# Patient Record
Sex: Male | Born: 1985
Health system: Southern US, Community
[De-identification: ages and names within clinical notes are randomized; demographics above are authoritative.]

---

## 2016-07-03 DIAGNOSIS — F411 Generalized anxiety disorder: Secondary | ICD-10-CM | POA: Diagnosis not present

## 2016-07-03 DIAGNOSIS — F909 Attention-deficit hyperactivity disorder, unspecified type: Secondary | ICD-10-CM | POA: Diagnosis not present

## 2016-07-03 DIAGNOSIS — F419 Anxiety disorder, unspecified: Secondary | ICD-10-CM | POA: Diagnosis not present

## 2017-06-17 DIAGNOSIS — R41840 Attention and concentration deficit: Secondary | ICD-10-CM | POA: Diagnosis not present

## 2017-07-13 DIAGNOSIS — F411 Generalized anxiety disorder: Secondary | ICD-10-CM | POA: Diagnosis not present

## 2017-07-13 DIAGNOSIS — F419 Anxiety disorder, unspecified: Secondary | ICD-10-CM | POA: Diagnosis not present

## 2017-12-16 DIAGNOSIS — F419 Anxiety disorder, unspecified: Secondary | ICD-10-CM | POA: Diagnosis not present

## 2017-12-16 DIAGNOSIS — F909 Attention-deficit hyperactivity disorder, unspecified type: Secondary | ICD-10-CM | POA: Diagnosis not present

## 2018-02-01 DIAGNOSIS — J45909 Unspecified asthma, uncomplicated: Secondary | ICD-10-CM | POA: Diagnosis not present

## 2018-02-01 DIAGNOSIS — J309 Allergic rhinitis, unspecified: Secondary | ICD-10-CM | POA: Diagnosis not present

## 2018-05-23 DIAGNOSIS — F909 Attention-deficit hyperactivity disorder, unspecified type: Secondary | ICD-10-CM | POA: Diagnosis not present

## 2018-05-23 DIAGNOSIS — F419 Anxiety disorder, unspecified: Secondary | ICD-10-CM | POA: Diagnosis not present

## 2018-09-29 ENCOUNTER — Encounter: Payer: Self-pay | Admitting: Podiatry

## 2018-09-29 ENCOUNTER — Other Ambulatory Visit: Payer: Self-pay

## 2018-09-29 ENCOUNTER — Ambulatory Visit: Payer: Managed Care, Other (non HMO) | Admitting: Podiatry

## 2018-09-29 VITALS — BP 120/88 | HR 74 | Temp 97.3°F | Resp 16

## 2018-09-29 DIAGNOSIS — B070 Plantar wart: Secondary | ICD-10-CM

## 2018-09-29 MED ORDER — FLUOROURACIL 5 % EX CREA: TOPICAL_CREAM | Freq: Two times a day (BID) | CUTANEOUS | 2 refills | Status: AC

## 2018-09-29 NOTE — Progress Notes (Signed)
   Subjective:    Patient ID: Steven Ray, male    DOB: 05-Jan-1986, 33 y.o.   MRN: [id number]  HPI    Review of Systems  All other systems reviewed and are negative.      Objective:   Physical Exam        Assessment & Plan:

## 2018-09-29 NOTE — Patient Instructions (Signed)

## 2018-09-29 NOTE — Progress Notes (Signed)
Subjective:   Patient ID: Steven Ray, male   DOB: 33 y.o.   MRN: [id number]   HPI Patient presents stating he has been having a lot of pain in the bottom of his right heel and the left fourth digit and states that this is been going on now for about 6 months and he is tried over-the-counter medicines which have not been successful.  Patient is quite active and likes to run and does not smoke.  Patient states they are both quite tender and came at the same time   Review of Systems  All other systems reviewed and are negative.       Objective:  Physical Exam Vitals signs and nursing note reviewed.  Constitutional:      Appearance: He is well-developed.  Pulmonary:     Effort: Pulmonary effort is normal.  Musculoskeletal: Normal range of motion.  Skin:    General: Skin is warm.  Neurological:     Mental Status: He is alert.     Neurovascular status intact muscle strength is adequate range of motion within normal limits.  Patient is found to have large keratotic lesion plantar lateral aspect right heel with dark spots and on the fourth digit left plantar digit with spotting also noted.  Upon debridement pinpoint bleeding is noted and they are painful to lateral pressure the patient was found to have good digital perfusion well oriented x3     Assessment:  Chronic verruca plantaris bilateral     Plan:  H&P education rendered and today sharp debridement accomplished and then application of immune agent to each area along with utilization of sterile dressings.  I instructed what to do if any blistering were to occur and did write prescription for Efudex to use in the next few days and patient will be seen back to evaluate again in the next 4 weeks or earlier if needed

## 2018-10-27 ENCOUNTER — Ambulatory Visit: Payer: Managed Care, Other (non HMO) | Admitting: Podiatry

## 2019-11-17 DIAGNOSIS — F419 Anxiety disorder, unspecified: Secondary | ICD-10-CM | POA: Diagnosis not present

## 2019-11-17 DIAGNOSIS — M25531 Pain in right wrist: Secondary | ICD-10-CM | POA: Diagnosis not present

## 2019-11-17 DIAGNOSIS — F909 Attention-deficit hyperactivity disorder, unspecified type: Secondary | ICD-10-CM | POA: Diagnosis not present

## 2019-12-24 DIAGNOSIS — B349 Viral infection, unspecified: Secondary | ICD-10-CM | POA: Diagnosis not present

## 2019-12-24 DIAGNOSIS — Z03818 Encounter for observation for suspected exposure to other biological agents ruled out: Secondary | ICD-10-CM | POA: Diagnosis not present

## 2019-12-24 DIAGNOSIS — R05 Cough: Secondary | ICD-10-CM | POA: Diagnosis not present

## 2020-01-02 DIAGNOSIS — Z113 Encounter for screening for infections with a predominantly sexual mode of transmission: Secondary | ICD-10-CM | POA: Diagnosis not present

## 2020-01-02 DIAGNOSIS — R05 Cough: Secondary | ICD-10-CM | POA: Diagnosis not present

## 2020-01-02 DIAGNOSIS — F411 Generalized anxiety disorder: Secondary | ICD-10-CM | POA: Diagnosis not present

## 2020-01-31 DIAGNOSIS — M25511 Pain in right shoulder: Secondary | ICD-10-CM | POA: Diagnosis not present

## 2020-02-14 DIAGNOSIS — M4722 Other spondylosis with radiculopathy, cervical region: Secondary | ICD-10-CM | POA: Diagnosis not present

## 2020-02-21 DIAGNOSIS — M542 Cervicalgia: Secondary | ICD-10-CM | POA: Diagnosis not present

## 2020-02-21 DIAGNOSIS — M5412 Radiculopathy, cervical region: Secondary | ICD-10-CM | POA: Diagnosis not present

## 2020-02-21 DIAGNOSIS — M25511 Pain in right shoulder: Secondary | ICD-10-CM | POA: Diagnosis not present

## 2020-02-27 DIAGNOSIS — M542 Cervicalgia: Secondary | ICD-10-CM | POA: Diagnosis not present

## 2020-02-27 DIAGNOSIS — M25511 Pain in right shoulder: Secondary | ICD-10-CM | POA: Diagnosis not present

## 2020-02-27 DIAGNOSIS — M5412 Radiculopathy, cervical region: Secondary | ICD-10-CM | POA: Diagnosis not present

## 2020-02-29 DIAGNOSIS — M25511 Pain in right shoulder: Secondary | ICD-10-CM | POA: Diagnosis not present

## 2020-02-29 DIAGNOSIS — M5412 Radiculopathy, cervical region: Secondary | ICD-10-CM | POA: Diagnosis not present

## 2020-02-29 DIAGNOSIS — M542 Cervicalgia: Secondary | ICD-10-CM | POA: Diagnosis not present

## 2020-03-05 DIAGNOSIS — M25511 Pain in right shoulder: Secondary | ICD-10-CM | POA: Diagnosis not present

## 2020-03-05 DIAGNOSIS — M5412 Radiculopathy, cervical region: Secondary | ICD-10-CM | POA: Diagnosis not present

## 2020-03-05 DIAGNOSIS — M542 Cervicalgia: Secondary | ICD-10-CM | POA: Diagnosis not present

## 2020-03-07 DIAGNOSIS — M25511 Pain in right shoulder: Secondary | ICD-10-CM | POA: Diagnosis not present

## 2020-03-07 DIAGNOSIS — M5412 Radiculopathy, cervical region: Secondary | ICD-10-CM | POA: Diagnosis not present

## 2020-03-07 DIAGNOSIS — M542 Cervicalgia: Secondary | ICD-10-CM | POA: Diagnosis not present

## 2020-03-11 DIAGNOSIS — M5412 Radiculopathy, cervical region: Secondary | ICD-10-CM | POA: Diagnosis not present

## 2020-03-11 DIAGNOSIS — M25511 Pain in right shoulder: Secondary | ICD-10-CM | POA: Diagnosis not present

## 2020-03-11 DIAGNOSIS — M542 Cervicalgia: Secondary | ICD-10-CM | POA: Diagnosis not present

## 2020-03-13 DIAGNOSIS — M25511 Pain in right shoulder: Secondary | ICD-10-CM | POA: Diagnosis not present

## 2020-03-13 DIAGNOSIS — M542 Cervicalgia: Secondary | ICD-10-CM | POA: Diagnosis not present

## 2020-03-13 DIAGNOSIS — M5412 Radiculopathy, cervical region: Secondary | ICD-10-CM | POA: Diagnosis not present

## 2020-03-14 ENCOUNTER — Emergency Department (HOSPITAL_COMMUNITY)
Admission: EM | Admit: 2020-03-14 | Discharge: 2020-03-14 | Disposition: A | Discharge status: Home / Self Care | Payer: BC Managed Care – PPO | Attending: Emergency Medicine | Admitting: Emergency Medicine

## 2020-03-14 ENCOUNTER — Encounter (HOSPITAL_COMMUNITY): Payer: Self-pay

## 2020-03-14 ENCOUNTER — Other Ambulatory Visit: Payer: Self-pay

## 2020-03-14 ENCOUNTER — Emergency Department (HOSPITAL_COMMUNITY): Payer: BC Managed Care – PPO

## 2020-03-14 DIAGNOSIS — S91311A Laceration without foreign body, right foot, initial encounter: Secondary | ICD-10-CM | POA: Insufficient documentation

## 2020-03-14 DIAGNOSIS — S61012A Laceration without foreign body of left thumb without damage to nail, initial encounter: Secondary | ICD-10-CM | POA: Diagnosis not present

## 2020-03-14 DIAGNOSIS — S01311A Laceration without foreign body of right ear, initial encounter: Secondary | ICD-10-CM | POA: Insufficient documentation

## 2020-03-14 DIAGNOSIS — W25XXXA Contact with sharp glass, initial encounter: Secondary | ICD-10-CM | POA: Insufficient documentation

## 2020-03-14 DIAGNOSIS — S99921A Unspecified injury of right foot, initial encounter: Secondary | ICD-10-CM | POA: Diagnosis not present

## 2020-03-14 DIAGNOSIS — R55 Syncope and collapse: Secondary | ICD-10-CM | POA: Insufficient documentation

## 2020-03-14 DIAGNOSIS — Z23 Encounter for immunization: Secondary | ICD-10-CM | POA: Diagnosis not present

## 2020-03-14 LAB — URINALYSIS, ROUTINE W REFLEX MICROSCOPIC
Bilirubin Urine: NEGATIVE
Glucose, UA: NEGATIVE mg/dL
Hgb urine dipstick: NEGATIVE
Ketones, ur: 5 mg/dL — AB
Leukocytes,Ua: NEGATIVE
Nitrite: NEGATIVE
Protein, ur: 30 mg/dL — AB
Specific Gravity, Urine: 1.021 (ref 1.005–1.030)
pH: 8 (ref 5.0–8.0)

## 2020-03-14 LAB — BASIC METABOLIC PANEL
Anion gap: 11 (ref 5–15)
BUN: 18 mg/dL (ref 6–20)
CO2: 22 mmol/L (ref 22–32)
Calcium: 9.5 mg/dL (ref 8.9–10.3)
Chloride: 104 mmol/L (ref 98–111)
Creatinine, Ser: 1.11 mg/dL (ref 0.61–1.24)
GFR, Estimated: >60 mL/min (ref >60)
Glucose, Bld: 123 mg/dL — ABNORMAL HIGH (ref 70–99)
Potassium: 4.1 mmol/L (ref 3.5–5.1)
Sodium: 137 mmol/L (ref 135–145)

## 2020-03-14 LAB — CBC
HCT: 45.2 % (ref 39.0–52.0)
Hemoglobin: 14.9 g/dL (ref 13.0–17.0)
MCH: 31.1 pg (ref 26.0–34.0)
MCHC: 33 g/dL (ref 30.0–36.0)
MCV: 94.4 fL (ref 80.0–100.0)
Platelets: 290 10*3/uL (ref 150–400)
RBC: 4.79 MIL/uL (ref 4.22–5.81)
RDW: 12.9 % (ref 11.5–15.5)
WBC: 5.8 10*3/uL (ref 4.0–10.5)
nRBC: 0 % (ref 0.0–0.2)

## 2020-03-14 LAB — CBG MONITORING, ED: Glucose-Capillary: 159 mg/dL — ABNORMAL HIGH (ref 70–99)

## 2020-03-14 MED ORDER — LIDOCAINE HCL (PF) 1 % IJ SOLN
5.0000 mL | Freq: Once | INTRAMUSCULAR | Status: AC
  Administered 2020-03-14: 5 mL
  Filled 2020-03-14: qty 30

## 2020-03-14 MED ORDER — TETANUS-DIPHTH-ACELL PERTUSSIS 5-2.5-18.5 LF-MCG/0.5 IM SUSP
0.5000 mL | Freq: Once | INTRAMUSCULAR | Status: AC
  Administered 2020-03-14: 0.5 mL via INTRAMUSCULAR
  Filled 2020-03-14: qty 0.5

## 2020-03-14 NOTE — ED Triage Notes (Signed)
Patient presents with a cut on his right foot after stepping on broken ceramic glass. He said he was losing a lot of blood and passed out and then cut his left hand/ base of thumb while losing consciousness. He said he did not have anything to eat before he passed out. Then had some orange juice and felt better.

## 2020-03-14 NOTE — ED Provider Notes (Signed)
Physical Exam  BP 120/70   Pulse 63   Temp 98.2 F (36.8 C) (Oral)   Resp 16   Ht 5\' 10"  (1.778 m)   Wt 94.3 kg   SpO2 97%   BMI 29.84 kg/m    ED Course/Procedures     . Laceration Repair  Date/Time: 03/14/2020 2:46 PM Performed by: 03/16/2020, PA-C Authorized by: Alveria Apley, PA-C   Consent:    Consent obtained:  Verbal   Consent given by:  Patient   Risks discussed:  Infection, need for additional repair, nerve damage, poor wound healing, poor cosmetic result and pain Anesthesia (see MAR for exact dosages):    Anesthesia method:  Local infiltration   Local anesthetic:  Lidocaine 1% w/o epi Laceration details:    Location:  Foot   Foot location:  Sole of R foot   Length (cm):  2.5 Repair type:    Repair type:  Simple Pre-procedure details:    Preparation:  Patient was prepped and draped in usual sterile fashion and imaging obtained to evaluate for foreign bodies Exploration:    Wound exploration: wound explored through full range of motion and entire depth of wound probed and visualized     Wound extent: no foreign bodies/material noted, no underlying fracture noted and no vascular damage noted   Treatment:    Area cleansed with:  Hibiclens   Amount of cleaning:  Standard Skin repair:    Repair method:  Sutures   Suture size:  3-0   Suture material:  Prolene   Suture technique:  Simple interrupted   Number of sutures:  3 Approximation:    Approximation:  Close Post-procedure details:    Dressing:  Sterile dressing   Patient tolerance of procedure:  Tolerated well, no immediate complications .Alveria ApleyLaceration Repair  Date/Time: 03/14/2020 2:49 PM Performed by: 03/16/2020, PA-C Authorized by: Alveria Apley, PA-C   Consent:    Consent obtained:  Verbal   Consent given by:  Patient   Risks discussed:  Infection, poor wound healing, poor cosmetic result, pain, need for additional repair and nerve damage Anesthesia (see MAR for exact  dosages):    Anesthesia method:  None Laceration details:    Location:  Finger   Finger location:  L thumb (dorsal proximal thumb)   Length (cm):  1   Depth (mm):  1 Repair type:    Repair type:  Simple Pre-procedure details:    Preparation:  Patient was prepped and draped in usual sterile fashion Exploration:    Hemostasis achieved with:  Direct pressure   Wound exploration: wound explored through full range of motion   Skin repair:    Repair method:  Tissue adhesive Approximation:    Approximation:  Close Post-procedure details:    Dressing:  Open (no dressing)   Patient tolerance of procedure:  Tolerated well, no immediate complications .Alveria ApleyLaceration Repair  Date/Time: 03/14/2020 2:50 PM Performed by: 03/16/2020, PA-C Authorized by: Alveria Apley, PA-C   Consent:    Consent obtained:  Verbal   Consent given by:  Patient   Risks discussed:  Infection, poor wound healing, pain, need for additional repair, poor cosmetic result and nerve damage Anesthesia (see MAR for exact dosages):    Anesthesia method:  None Laceration details:    Location:  Ear   Ear location:  R ear   Length (cm):  0.5   Depth (mm):  1 Repair type:    Repair type:  Simple Exploration:    Wound exploration: entire  depth of wound probed and visualized   Skin repair:    Repair method:  Tissue adhesive Approximation:    Approximation:  Close Post-procedure details:    Dressing:  Open (no dressing)   Patient tolerance of procedure:  Tolerated well, no immediate complications    MDM         Alveria Apley, PA-C 03/14/20 1451    Benjiman Core, MD 03/14/20 1654

## 2020-03-14 NOTE — ED Provider Notes (Signed)
[facility]-EMERGENCY DEPT Provider Note   CSN: [id number] Arrival date & time: 03/14/20  9563     History Chief Complaint  Patient presents with  . Loss of Consciousness  . Extremity Laceration    Steven Ray is a 34 y.o. male.  HPI Patient presents after foot injury and syncopal episode.  States he accidentally dropped a ceramic mug and then stepped on a cutting his foot.  States there was a lot of blood.  States he put some superglue on his foot but then became lightheaded and passed out.  States he did scrape his left hand and is bleeding from his right ear.  Feels much better now.  No neck pain.  No headache.  No confusion.  No numbness in his foot.  Unsure of his last tetanus shot..   History reviewed. No pertinent past medical history.  There are no problems to display for this patient.   History reviewed. No pertinent surgical history.     Family History  Problem Relation Age of Onset  . Healthy Mother   . Healthy Father     Social History   Tobacco Use  . Smoking status: Never Smoker  . Smokeless tobacco: Never Used  Vaping Use  . Vaping Use: Every day  . Devices: vape pen  Substance Use Topics  . Alcohol use: Yes    Comment: weekends 5 beers  . Drug use: Not on file    Home Medications Prior to Admission medications   Medication Sig Start Date End Date Taking? Authorizing Provider  fluorouracil (EFUDEX) 5 % cream Apply topically 2 (two) times daily. 09/29/18   Lenn Sink, DPM    Allergies    Patient has no known allergies.  Review of Systems   Review of Systems  Constitutional: Negative for appetite change.  Respiratory: Negative for shortness of breath.   Gastrointestinal: Negative for abdominal pain.  Musculoskeletal: Negative for back pain.  Skin: Negative for wound.  Neurological: Positive for syncope.  Psychiatric/Behavioral: Negative for confusion.    Physical Exam Updated Vital Signs BP (!) 129/92    Pulse 73   Temp 98 F (36.7 C) (Oral)   Resp 15   Ht 5\' 10"  (1.778 m)   Wt 94.3 kg   SpO2 97%   BMI 29.84 kg/m   Physical Exam Vitals and nursing note reviewed.  HENT:     Ears:     Comments: Superficial laceration on superior aspect of right ear.  Laceration only partway through skin. Eyes:     Extraocular Movements: Extraocular movements intact.     Pupils: Pupils are equal, round, and reactive to light.  Cardiovascular:     Rate and Rhythm: Regular rhythm.  Pulmonary:     Breath sounds: No wheezing.  Abdominal:     Tenderness: There is no abdominal tenderness.  Musculoskeletal:     Cervical back: Neck supple.     Comments: Approximately 1 cm laceration over left thumb first MCP joint.  Only part way through the skin.  Good range of motion without underlying bony tenderness.  Along ball of left foot plantar aspect there is approximately 2.5 cm laceration.  Good flexion extension of the toes.  No underlying bony tenderness.  Skin:    General: Skin is warm.     Capillary Refill: Capillary refill takes less than 2 seconds.  Neurological:     General: No focal deficit present.     Mental Status: He is alert and oriented  to person, place, and time.  Psychiatric:        Behavior: Behavior normal.     ED Results / Procedures / Treatments   Labs (all labs ordered are listed, but only abnormal results are displayed) Labs Reviewed  BASIC METABOLIC PANEL - Abnormal; Notable for the following components:      Result Value   Glucose, Bld 123 (*)    All other components within normal limits  URINALYSIS, ROUTINE W REFLEX MICROSCOPIC - Abnormal; Notable for the following components:   APPearance HAZY (*)    Ketones, ur 5 (*)    Protein, ur 30 (*)    Bacteria, UA FEW (*)    All other components within normal limits  CBG MONITORING, ED - Abnormal; Notable for the following components:   Glucose-Capillary 159 (*)    All other components within normal limits  CBC    EKG EKG  Interpretation  Date/Time:  Thursday March 14 2020 08:36:35 EDT Ventricular Rate:  72 PR Interval:    QRS Duration: 102 QT Interval:  388 QTC Calculation: 425 R Axis:   47 Text Interpretation: Sinus rhythm ST elev, probable normal early repol pattern 12 Lead; Mason-Likar Confirmed by Benjiman Core [phone]) on 03/14/2020 11:58:48 AM   Radiology DG Foot 2 Views Right  Result Date: 03/14/2020 CLINICAL DATA:  Laceration of the plantar aspect of foot near the first MTP joint. Evaluate for foreign body. EXAM: RIGHT FOOT - 2 VIEW COMPARISON:  None. FINDINGS: There is no evidence of fracture or dislocation. There is no evidence of arthropathy or other focal bone abnormality. Soft tissues are unremarkable. No radiopaque soft tissue foreign body. IMPRESSION: Negative. No fracture or radiopaque soft tissue foreign body. Electronically Signed   By: Duanne Guess D.O.   On: 03/14/2020 13:05    Procedures Procedures (including critical care time)  Medications Ordered in ED Medications  Tdap (BOOSTRIX) injection 0.5 mL (0.5 mLs Intramuscular Given 03/14/20 1222)  lidocaine (PF) (XYLOCAINE) 1 % injection 5 mL (5 mLs Infiltration Given by Other 03/14/20 1225)    ED Course  I have reviewed the triage vital signs and the nursing notes.  Pertinent labs & imaging results that were available during my care of the patient were reviewed by me and considered in my medical decision making (see chart for details).    MDM Rules/Calculators/A&P                          Patient with syncopal episode after laceration to foot.  I think most likely vagal event.  Laceration to foot closed.  X-ray shows no foreign body.  Syncope work-up reassuring.  Also superficial laceration to ear and contralateral thumb were both closed with Dermabond.  Outpatient follow-up as needed.  Do not think the patient needs imaging of his head.  Rather superficial cut and back at baseline reportedly syncope was before he hit  his head. Final Clinical Impression(s) / ED Diagnoses Final diagnoses:  Laceration of right foot, initial encounter  Laceration of auricle of right ear, initial encounter  Laceration of left thumb without foreign body without damage to nail, initial encounter  Syncope, unspecified syncope type    Rx / DC Orders ED Discharge Orders    None       Benjiman Core, MD 03/14/20 1656

## 2020-03-14 NOTE — Discharge Instructions (Signed)
Have the stitches taken out in around 10 days.  Do not submerge them in water for at least a week.

## 2020-03-15 DIAGNOSIS — S91311D Laceration without foreign body, right foot, subsequent encounter: Secondary | ICD-10-CM | POA: Diagnosis not present

## 2020-03-15 DIAGNOSIS — B999 Unspecified infectious disease: Secondary | ICD-10-CM | POA: Diagnosis not present

## 2020-03-15 DIAGNOSIS — Z1152 Encounter for screening for COVID-19: Secondary | ICD-10-CM | POA: Diagnosis not present

## 2020-03-22 DIAGNOSIS — Z03818 Encounter for observation for suspected exposure to other biological agents ruled out: Secondary | ICD-10-CM | POA: Diagnosis not present

## 2020-03-24 DIAGNOSIS — S91311D Laceration without foreign body, right foot, subsequent encounter: Secondary | ICD-10-CM | POA: Diagnosis not present

## 2020-03-27 DIAGNOSIS — M542 Cervicalgia: Secondary | ICD-10-CM | POA: Diagnosis not present

## 2020-03-27 DIAGNOSIS — M5412 Radiculopathy, cervical region: Secondary | ICD-10-CM | POA: Diagnosis not present

## 2020-03-27 DIAGNOSIS — M25511 Pain in right shoulder: Secondary | ICD-10-CM | POA: Diagnosis not present

## 2020-04-24 DIAGNOSIS — M542 Cervicalgia: Secondary | ICD-10-CM | POA: Diagnosis not present

## 2020-04-24 DIAGNOSIS — M5412 Radiculopathy, cervical region: Secondary | ICD-10-CM | POA: Diagnosis not present

## 2020-04-24 DIAGNOSIS — M25511 Pain in right shoulder: Secondary | ICD-10-CM | POA: Diagnosis not present

## 2020-04-30 DIAGNOSIS — M5412 Radiculopathy, cervical region: Secondary | ICD-10-CM | POA: Diagnosis not present

## 2020-04-30 DIAGNOSIS — M25511 Pain in right shoulder: Secondary | ICD-10-CM | POA: Diagnosis not present

## 2020-04-30 DIAGNOSIS — M542 Cervicalgia: Secondary | ICD-10-CM | POA: Diagnosis not present

## 2020-05-03 DIAGNOSIS — M5412 Radiculopathy, cervical region: Secondary | ICD-10-CM | POA: Diagnosis not present

## 2020-05-03 DIAGNOSIS — M25511 Pain in right shoulder: Secondary | ICD-10-CM | POA: Diagnosis not present

## 2020-05-03 DIAGNOSIS — M542 Cervicalgia: Secondary | ICD-10-CM | POA: Diagnosis not present

## 2020-05-07 DIAGNOSIS — M542 Cervicalgia: Secondary | ICD-10-CM | POA: Diagnosis not present

## 2020-05-07 DIAGNOSIS — M25511 Pain in right shoulder: Secondary | ICD-10-CM | POA: Diagnosis not present

## 2020-05-07 DIAGNOSIS — M5412 Radiculopathy, cervical region: Secondary | ICD-10-CM | POA: Diagnosis not present

## 2020-05-09 DIAGNOSIS — M542 Cervicalgia: Secondary | ICD-10-CM | POA: Diagnosis not present

## 2020-05-09 DIAGNOSIS — M5412 Radiculopathy, cervical region: Secondary | ICD-10-CM | POA: Diagnosis not present

## 2020-05-09 DIAGNOSIS — M25511 Pain in right shoulder: Secondary | ICD-10-CM | POA: Diagnosis not present

## 2020-05-14 DIAGNOSIS — M25511 Pain in right shoulder: Secondary | ICD-10-CM | POA: Diagnosis not present

## 2020-05-14 DIAGNOSIS — M5412 Radiculopathy, cervical region: Secondary | ICD-10-CM | POA: Diagnosis not present

## 2020-05-14 DIAGNOSIS — M542 Cervicalgia: Secondary | ICD-10-CM | POA: Diagnosis not present

## 2020-05-16 DIAGNOSIS — M5412 Radiculopathy, cervical region: Secondary | ICD-10-CM | POA: Diagnosis not present

## 2020-05-16 DIAGNOSIS — M542 Cervicalgia: Secondary | ICD-10-CM | POA: Diagnosis not present

## 2020-05-16 DIAGNOSIS — M25511 Pain in right shoulder: Secondary | ICD-10-CM | POA: Diagnosis not present

## 2020-05-20 DIAGNOSIS — M5412 Radiculopathy, cervical region: Secondary | ICD-10-CM | POA: Diagnosis not present

## 2020-05-20 DIAGNOSIS — M25511 Pain in right shoulder: Secondary | ICD-10-CM | POA: Diagnosis not present

## 2020-05-20 DIAGNOSIS — M542 Cervicalgia: Secondary | ICD-10-CM | POA: Diagnosis not present

## 2020-05-31 DIAGNOSIS — M25511 Pain in right shoulder: Secondary | ICD-10-CM | POA: Diagnosis not present

## 2020-05-31 DIAGNOSIS — M5412 Radiculopathy, cervical region: Secondary | ICD-10-CM | POA: Diagnosis not present

## 2020-05-31 DIAGNOSIS — M542 Cervicalgia: Secondary | ICD-10-CM | POA: Diagnosis not present

## 2020-06-28 DIAGNOSIS — Z20822 Contact with and (suspected) exposure to covid-19: Secondary | ICD-10-CM | POA: Diagnosis not present

## 2020-06-28 DIAGNOSIS — Z03818 Encounter for observation for suspected exposure to other biological agents ruled out: Secondary | ICD-10-CM | POA: Diagnosis not present

## 2020-07-30 DIAGNOSIS — M542 Cervicalgia: Secondary | ICD-10-CM | POA: Diagnosis not present

## 2020-08-05 DIAGNOSIS — M4722 Other spondylosis with radiculopathy, cervical region: Secondary | ICD-10-CM | POA: Diagnosis not present

## 2020-08-05 DIAGNOSIS — M542 Cervicalgia: Secondary | ICD-10-CM | POA: Diagnosis not present

## 2020-08-05 DIAGNOSIS — M4692 Unspecified inflammatory spondylopathy, cervical region: Secondary | ICD-10-CM | POA: Diagnosis not present

## 2020-08-16 DIAGNOSIS — M5412 Radiculopathy, cervical region: Secondary | ICD-10-CM | POA: Diagnosis not present

## 2020-08-28 DIAGNOSIS — M5412 Radiculopathy, cervical region: Secondary | ICD-10-CM | POA: Diagnosis not present

## 2020-09-11 DIAGNOSIS — Z20822 Contact with and (suspected) exposure to covid-19: Secondary | ICD-10-CM | POA: Diagnosis not present

## 2020-09-11 DIAGNOSIS — Z03818 Encounter for observation for suspected exposure to other biological agents ruled out: Secondary | ICD-10-CM | POA: Diagnosis not present

## 2020-09-27 DIAGNOSIS — M5412 Radiculopathy, cervical region: Secondary | ICD-10-CM | POA: Diagnosis not present

## 2020-12-20 DIAGNOSIS — Z20822 Contact with and (suspected) exposure to covid-19: Secondary | ICD-10-CM | POA: Diagnosis not present

## 2021-02-03 DIAGNOSIS — F411 Generalized anxiety disorder: Secondary | ICD-10-CM | POA: Diagnosis not present

## 2021-02-10 DIAGNOSIS — F4323 Adjustment disorder with mixed anxiety and depressed mood: Secondary | ICD-10-CM | POA: Diagnosis not present

## 2021-02-17 DIAGNOSIS — F4323 Adjustment disorder with mixed anxiety and depressed mood: Secondary | ICD-10-CM | POA: Diagnosis not present

## 2021-03-06 DIAGNOSIS — F4323 Adjustment disorder with mixed anxiety and depressed mood: Secondary | ICD-10-CM | POA: Diagnosis not present

## 2021-06-03 DIAGNOSIS — F4323 Adjustment disorder with mixed anxiety and depressed mood: Secondary | ICD-10-CM | POA: Diagnosis not present

## 2021-06-05 DIAGNOSIS — F429 Obsessive-compulsive disorder, unspecified: Secondary | ICD-10-CM | POA: Diagnosis not present

## 2021-06-05 DIAGNOSIS — F411 Generalized anxiety disorder: Secondary | ICD-10-CM | POA: Diagnosis not present

## 2021-06-05 DIAGNOSIS — Z79891 Long term (current) use of opiate analgesic: Secondary | ICD-10-CM | POA: Diagnosis not present

## 2021-07-01 DIAGNOSIS — F411 Generalized anxiety disorder: Secondary | ICD-10-CM | POA: Diagnosis not present

## 2021-07-01 DIAGNOSIS — F429 Obsessive-compulsive disorder, unspecified: Secondary | ICD-10-CM | POA: Diagnosis not present

## 2021-07-15 DIAGNOSIS — R059 Cough, unspecified: Secondary | ICD-10-CM | POA: Diagnosis not present

## 2021-07-31 DIAGNOSIS — F429 Obsessive-compulsive disorder, unspecified: Secondary | ICD-10-CM | POA: Diagnosis not present

## 2021-07-31 DIAGNOSIS — F411 Generalized anxiety disorder: Secondary | ICD-10-CM | POA: Diagnosis not present

## 2021-12-04 IMAGING — DX DG FOOT 2V*R*
2 series · 2 of 2 positions shown · non-contrast
Comparison: None.

CLINICAL DATA: Laceration of the plantar aspect of foot near the
first MTP joint. Evaluate for foreign body.

EXAM:
RIGHT FOOT - 2 VIEW

[foot ap]
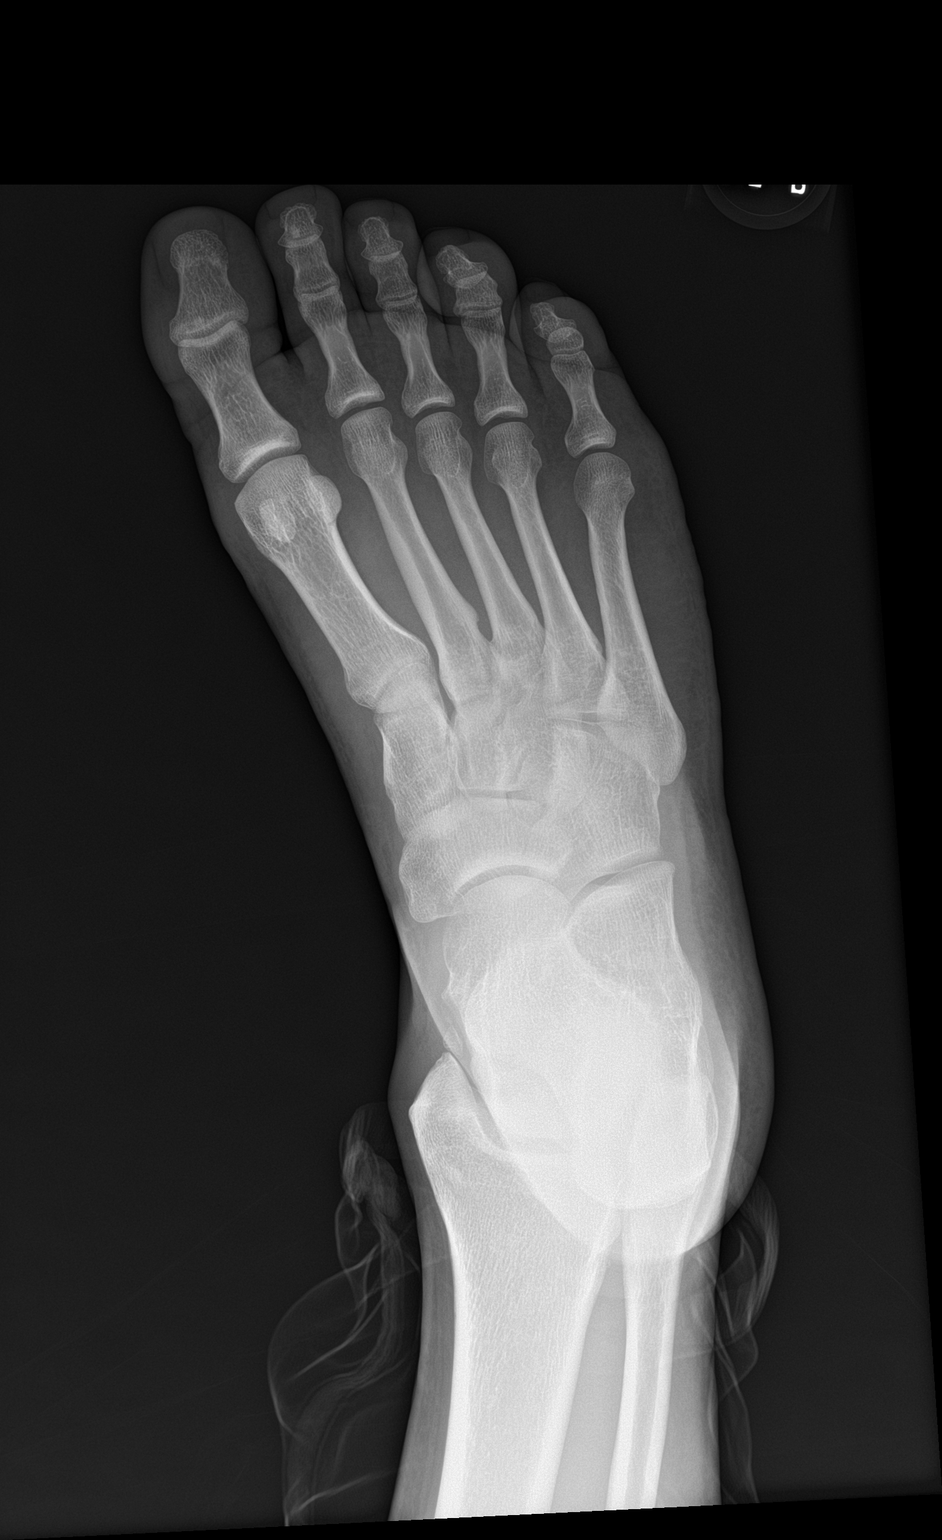

[foot lat]
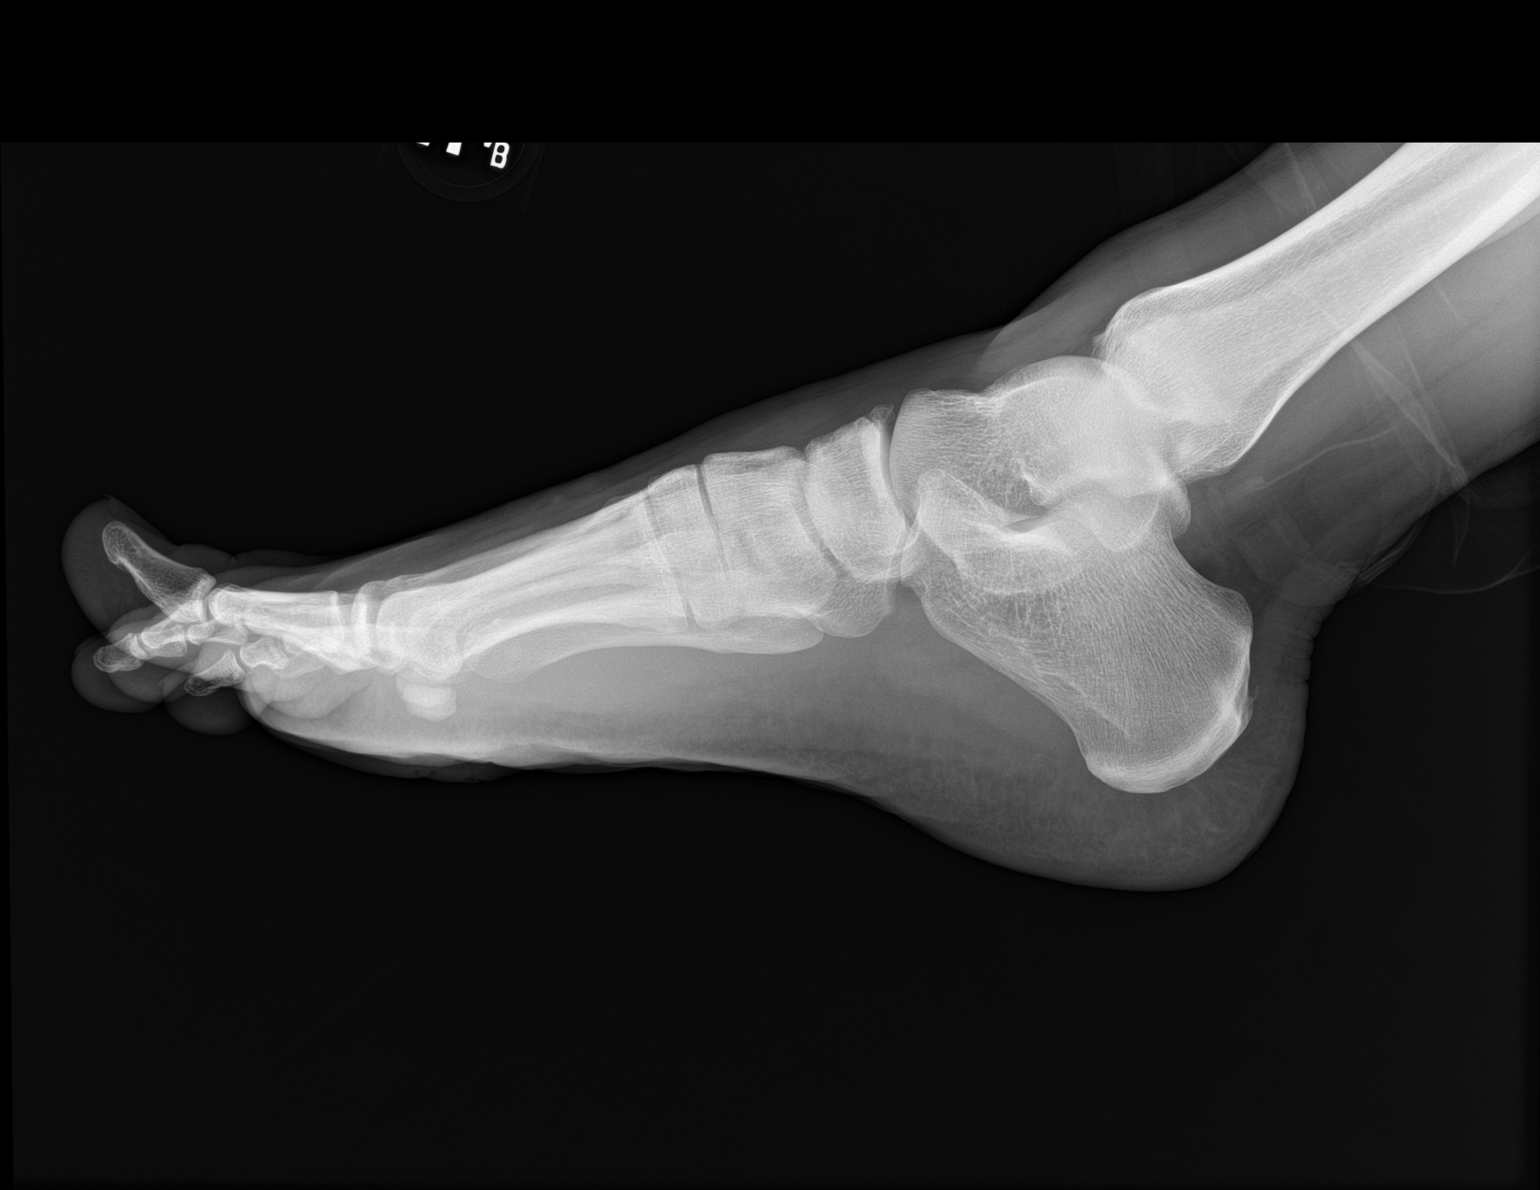

[2 of 2 positions shown; findings below may reference images not displayed]

FINDINGS: There is no evidence of fracture or dislocation. There is no
evidence of arthropathy or other focal bone abnormality. Soft
tissues are unremarkable. No radiopaque soft tissue foreign body.
IMPRESSION: Negative. No fracture or radiopaque soft tissue foreign body.

## 2022-02-03 DIAGNOSIS — D229 Melanocytic nevi, unspecified: Secondary | ICD-10-CM | POA: Diagnosis not present

## 2022-02-03 DIAGNOSIS — F411 Generalized anxiety disorder: Secondary | ICD-10-CM | POA: Diagnosis not present

## 2022-02-03 DIAGNOSIS — Z6830 Body mass index (BMI) 30.0-30.9, adult: Secondary | ICD-10-CM | POA: Diagnosis not present

## 2022-02-03 DIAGNOSIS — R9431 Abnormal electrocardiogram [ECG] [EKG]: Secondary | ICD-10-CM | POA: Diagnosis not present

## 2022-02-19 ENCOUNTER — Encounter: Payer: Self-pay | Admitting: Internal Medicine

## 2022-02-19 ENCOUNTER — Ambulatory Visit: Payer: BC Managed Care – PPO | Admitting: Internal Medicine

## 2022-02-19 VITALS — BP 118/80 | HR 80 | Temp 97.7°F | Resp 16 | Ht 70.0 in | Wt 213.0 lb

## 2022-02-19 DIAGNOSIS — R002 Palpitations: Secondary | ICD-10-CM | POA: Insufficient documentation

## 2022-02-19 DIAGNOSIS — F411 Generalized anxiety disorder: Secondary | ICD-10-CM | POA: Insufficient documentation

## 2022-02-19 DIAGNOSIS — R55 Syncope and collapse: Secondary | ICD-10-CM | POA: Diagnosis not present

## 2022-02-19 NOTE — Progress Notes (Signed)
Primary Physician/Referring:  [place], [place] @ [place]  Patient ID: Steven Ray, male    DOB: 1985-09-12, 36 y.o.   MRN: [id number]  No chief complaint on file.  HPI:    Steven Ray  is a 36 y.o. male with no reported cardiac history who is here to establish care with cardiology. He had a few syncopal episodes in the last few years which sound vasovagal. He passed out from dehydration one time. Another time he had a big cut on his foot and it was bleeding a lot and he passed out again. He has felt pre-syncopal when donating blood. He does not smoke or drink excessive alcohol. No family history of early heart disease or sudden cardiac death. There is no congenital heart disease that runs in the family either. Patient has unchanged EKGs which show early repolarization and this is a normal variant in young men. He denies chest pain, shortness of breath, palpitations, diaphoresis, syncope, edema, PND, orthopnea.   History reviewed. No pertinent past medical history. History reviewed. No pertinent surgical history. Family History  Problem Relation Age of Onset  . Healthy Mother   . Healthy Father     Social History   Tobacco Use  . Smoking status: Never  . Smokeless tobacco: Never  Substance Use Topics  . Alcohol use: Yes    Comment: weekends 5 beers   Marital Status: Divorced  ROS  Review of Systems  Cardiovascular:  Positive for near-syncope and syncope.   Objective  Blood pressure 118/80, pulse 80, temperature 97.7 F (36.5 C), temperature source Temporal, resp. rate 16, height 5\' 10"  (1.778 m), weight 213 lb (96.6 kg), SpO2 97 %. Body mass index is 30.56 kg/m.     02/19/2022    3:07 PM 03/14/2020    3:15 PM 03/14/2020    3:00 PM  Vitals with BMI  Height 5\' 10"     Weight 213 lbs    BMI 27.03    Systolic 500 938 182  Diastolic 80 92 92  Pulse 80 73 70     Physical Exam Vitals and nursing note reviewed.  Constitutional:      General: He is  not in acute distress. HENT:     Head: Normocephalic and atraumatic.  Cardiovascular:     Rate and Rhythm: Normal rate and regular rhythm.     Pulses: Normal pulses.     Heart sounds: Normal heart sounds. No murmur heard. Pulmonary:     Effort: Pulmonary effort is normal.     Breath sounds: Normal breath sounds.  Abdominal:     General: Bowel sounds are normal.  Musculoskeletal:     Right lower leg: No edema.     Left lower leg: No edema.  Skin:    General: Skin is warm and dry.  Neurological:     Mental Status: He is alert.   Medications and allergies  No Known Allergies   Medication list after today's encounter   Current Outpatient Medications:  .  citalopram (CELEXA) 20 MG tablet, Take 20 mg by mouth daily., Disp: , Rfl:  .  fluorouracil (EFUDEX) 5 % cream, Apply topically 2 (two) times daily., Disp: 40 g, Rfl: 2  Laboratory examination:   Lab Results  Component Value Date   NA 137 03/14/2020   K 4.1 03/14/2020   CO2 22 03/14/2020   GLUCOSE 123 (H) 03/14/2020   BUN 18 03/14/2020   CREATININE 1.11 03/14/2020   CALCIUM 9.5 03/14/2020   GFRNONAA >  60 03/14/2020       Latest Ref Rng & Units 03/14/2020    9:29 AM  CMP  Glucose 70 - 99 mg/dL 195   BUN 6 - 20 mg/dL 18   Creatinine 0.93 - 1.24 mg/dL 2.67   Sodium 124 - 580 mmol/L 137   Potassium 3.5 - 5.1 mmol/L 4.1   Chloride 98 - 111 mmol/L 104   CO2 22 - 32 mmol/L 22   Calcium 8.9 - 10.3 mg/dL 9.5       Latest Ref Rng & Units 03/14/2020    9:29 AM  CBC  WBC 4.0 - 10.5 K/uL 5.8   Hemoglobin 13.0 - 17.0 g/dL 99.8   Hematocrit 33.8 - 52.0 % 45.2   Platelets 150 - 400 K/uL 290     Lipid Panel No results for input(s): "CHOL", "TRIG", "LDLCALC", "VLDL", "HDL", "CHOLHDL", "LDLDIRECT" in the last 8760 hours.  HEMOGLOBIN A1C No results found for: "HGBA1C", "MPG" TSH No results for input(s): "TSH" in the last 8760 hours.  External labs:     Radiology:    Cardiac Studies:   No results found for  this or any previous visit from the past 1095 days.     No results found for this or any previous visit from the past 1095 days.     EKG:   02/18/22 NSR (72 bpm), normal axis, normal R wave progression, no evidence of ischemia  Assessment     ICD-10-CM   1. Palpitations  R00.2 EKG 12-Lead    2. Pre-syncope  R55     3. Vasovagal syncope  R55        Orders Placed This Encounter  Procedures  . EKG 12-Lead    No orders of the defined types were placed in this encounter.   There are no discontinued medications.   Recommendations:   Steven Ray is a 36 y.o.  male with early repolarization on EKG  Palpitations Likely due to anxiety  Pre-syncope Symptoms do not sound cardiac in nature Patient has had 2-3 syncopal episodes in his entire life  Vasovagal syncope Related to dehydration and sight of blood EKGs normal with evidence of early repolarization Follow-up PRN      Clotilde Dieter, DO, [facility]  02/19/2022, 3:39 PM Office: [phone] Pager: [phone]

## 2022-04-02 DIAGNOSIS — Z03818 Encounter for observation for suspected exposure to other biological agents ruled out: Secondary | ICD-10-CM | POA: Diagnosis not present

## 2022-04-02 DIAGNOSIS — J069 Acute upper respiratory infection, unspecified: Secondary | ICD-10-CM | POA: Diagnosis not present

## 2022-04-02 DIAGNOSIS — Z6830 Body mass index (BMI) 30.0-30.9, adult: Secondary | ICD-10-CM | POA: Diagnosis not present

## 2022-04-02 DIAGNOSIS — R051 Acute cough: Secondary | ICD-10-CM | POA: Diagnosis not present

## 2022-04-14 DIAGNOSIS — R12 Heartburn: Secondary | ICD-10-CM | POA: Diagnosis not present

## 2022-05-28 DIAGNOSIS — N489 Disorder of penis, unspecified: Secondary | ICD-10-CM | POA: Diagnosis not present

## 2022-05-28 DIAGNOSIS — Z113 Encounter for screening for infections with a predominantly sexual mode of transmission: Secondary | ICD-10-CM | POA: Diagnosis not present

## 2022-10-22 DIAGNOSIS — R0981 Nasal congestion: Secondary | ICD-10-CM | POA: Diagnosis not present

## 2022-10-22 DIAGNOSIS — R051 Acute cough: Secondary | ICD-10-CM | POA: Diagnosis not present

## 2022-10-22 DIAGNOSIS — J209 Acute bronchitis, unspecified: Secondary | ICD-10-CM | POA: Diagnosis not present

## 2022-10-22 DIAGNOSIS — Z03818 Encounter for observation for suspected exposure to other biological agents ruled out: Secondary | ICD-10-CM | POA: Diagnosis not present

## 2022-11-05 DIAGNOSIS — F411 Generalized anxiety disorder: Secondary | ICD-10-CM | POA: Diagnosis not present

## 2022-12-16 DIAGNOSIS — F419 Anxiety disorder, unspecified: Secondary | ICD-10-CM | POA: Diagnosis not present

## 2022-12-16 DIAGNOSIS — S90454A Superficial foreign body, right lesser toe(s), initial encounter: Secondary | ICD-10-CM | POA: Diagnosis not present

## 2022-12-16 DIAGNOSIS — Z6831 Body mass index (BMI) 31.0-31.9, adult: Secondary | ICD-10-CM | POA: Diagnosis not present

## 2022-12-17 ENCOUNTER — Ambulatory Visit (INDEPENDENT_AMBULATORY_CARE_PROVIDER_SITE_OTHER): Payer: BC Managed Care – PPO

## 2022-12-17 ENCOUNTER — Ambulatory Visit: Payer: BC Managed Care – PPO | Admitting: Podiatry

## 2022-12-17 DIAGNOSIS — S90454A Superficial foreign body, right lesser toe(s), initial encounter: Secondary | ICD-10-CM | POA: Diagnosis not present

## 2022-12-17 DIAGNOSIS — M778 Other enthesopathies, not elsewhere classified: Secondary | ICD-10-CM

## 2022-12-17 MED ORDER — CEPHALEXIN 500 MG PO CAPS: 500.0000 mg | ORAL_CAPSULE | Freq: Three times a day (TID) | ORAL | 0 refills | Status: AC

## 2022-12-17 NOTE — Progress Notes (Signed)
  Subjective:  Patient ID: Steven Ray, male    DOB: [birth year],  MRN: [id number]   37 y.o. male presents with concern for possible superficial foreign body in the right great toe.  Patient stepped on something while getting a bathing suit on approximately 2 weeks ago.  He says there was bleeding when it happened.  He is still having pain and a bump at the area that was the entry wound.  He thinks it may be something in there.  Painful when walking on the foot especially barefoot.    No past medical history on file.  No Known Allergies  ROS: Negative except as per HPI above  Objective:  General: AAO x3, NAD  Dermatological: Attention directed to the right plantar hallux there is a small eschar present on the plantar medial aspect of the right hallux concern for possible entry wound that is healed over.  There is pain on palpation of the area.  There is a raised bump at the area as well.  Vascular:  Dorsalis Pedis artery and Posterior Tibial artery pedal pulses are 2/4 bilateral.  Capillary fill time < 3 sec to all digits.   Neruologic: Grossly intact via light touch bilateral. Protective threshold intact to all sites bilateral.   Musculoskeletal: No gross boney pedal deformities bilateral. No pain, crepitus, or limitation noted with foot and ankle range of motion bilateral. Muscular strength 5/5 in all groups tested bilateral.  Gait: Unassisted, Nonantalgic.   No images are attached to the encounter.  Radiographs:  Date: 12/17/2022 XR the right foot Weightbearing AP/Lateral/Oblique   Findings: On lateral view questionable possible tiny radiopaque foreign object at the plantar aspect of the soft tissue of the hallux however this is seen on lateral view and therefore indeterminant given overlap Assessment:   1. Foreign body of skin of toe of right foot   2. Capsulitis of foot      Plan:  Patient was evaluated and treated and all questions answered.  # Foreign body of the  right hallux -Discussed with patient I do believe that he may have a small superficial foreign body whether that be a wood splinter or piece of glass present in the right great toe -Recommend to proceed with foreign body removal -After sterile alcohol prep injected the right hallux in standard fashion hallux block with 1-1/2 cc 2% lidocaine plain with 1-1/2 cc half percent Marcaine plain -Then proceeded to use a small incision with a 15 blade to open up the area and use forceps to identify and remove a foreign body which was consistent with a small broken shard of glass and then irrigated with saline and closed with Steri-Strip and band aid -eRx for cephalexin 500 mg 3 times daily for 5 days  Return if symptoms worsen or fail to improve.          Corinna Gab, DPM [facility] / [facility]

## 2023-04-19 DIAGNOSIS — F909 Attention-deficit hyperactivity disorder, unspecified type: Secondary | ICD-10-CM | POA: Diagnosis not present

## 2023-04-19 DIAGNOSIS — F411 Generalized anxiety disorder: Secondary | ICD-10-CM | POA: Diagnosis not present

## 2023-05-27 DIAGNOSIS — R059 Cough, unspecified: Secondary | ICD-10-CM | POA: Diagnosis not present

## 2023-05-27 DIAGNOSIS — R0981 Nasal congestion: Secondary | ICD-10-CM | POA: Diagnosis not present

## 2023-05-27 DIAGNOSIS — R051 Acute cough: Secondary | ICD-10-CM | POA: Diagnosis not present

## 2023-06-25 DIAGNOSIS — F909 Attention-deficit hyperactivity disorder, unspecified type: Secondary | ICD-10-CM | POA: Diagnosis not present

## 2023-06-25 DIAGNOSIS — Z131 Encounter for screening for diabetes mellitus: Secondary | ICD-10-CM | POA: Diagnosis not present

## 2023-06-25 DIAGNOSIS — Z13220 Encounter for screening for lipoid disorders: Secondary | ICD-10-CM | POA: Diagnosis not present

## 2023-06-25 DIAGNOSIS — F411 Generalized anxiety disorder: Secondary | ICD-10-CM | POA: Diagnosis not present

## 2023-10-12 DIAGNOSIS — F909 Attention-deficit hyperactivity disorder, unspecified type: Secondary | ICD-10-CM | POA: Diagnosis not present

## 2023-10-12 DIAGNOSIS — F411 Generalized anxiety disorder: Secondary | ICD-10-CM | POA: Diagnosis not present

## 2023-10-12 DIAGNOSIS — Z1389 Encounter for screening for other disorder: Secondary | ICD-10-CM | POA: Diagnosis not present

## 2023-10-12 DIAGNOSIS — E7800 Pure hypercholesterolemia, unspecified: Secondary | ICD-10-CM | POA: Diagnosis not present

## 2023-10-28 DIAGNOSIS — R0981 Nasal congestion: Secondary | ICD-10-CM | POA: Diagnosis not present

## 2023-12-11 DIAGNOSIS — Z202 Contact with and (suspected) exposure to infections with a predominantly sexual mode of transmission: Secondary | ICD-10-CM | POA: Diagnosis not present

## 2023-12-11 DIAGNOSIS — Z113 Encounter for screening for infections with a predominantly sexual mode of transmission: Secondary | ICD-10-CM | POA: Diagnosis not present

## 2023-12-12 DIAGNOSIS — Z113 Encounter for screening for infections with a predominantly sexual mode of transmission: Secondary | ICD-10-CM | POA: Diagnosis not present

## 2023-12-22 ENCOUNTER — Ambulatory Visit (HOSPITAL_BASED_OUTPATIENT_CLINIC_OR_DEPARTMENT_OTHER): Admitting: Internal Medicine

## 2023-12-22 ENCOUNTER — Encounter (HOSPITAL_BASED_OUTPATIENT_CLINIC_OR_DEPARTMENT_OTHER): Payer: Self-pay | Admitting: Internal Medicine

## 2023-12-22 VITALS — BP 136/94 | HR 86 | Ht 70.0 in | Wt 208.0 lb

## 2023-12-22 DIAGNOSIS — E7800 Pure hypercholesterolemia, unspecified: Secondary | ICD-10-CM

## 2023-12-22 NOTE — Progress Notes (Signed)
 LIPID CLINIC CONSULT NOTE  Chief Complaint:  Manage dyslipidemia  Primary Care Physician: Katina Pfeiffer, PA-C  Primary Cardiologist:  None  HPI:  Jakylan Ron is a 38 y.o. male who is being seen today for the evaluation of dyslipidemia at the request of Katina Pfeiffer, [place]. This is a pleasant 38 year old male kindly referred for evaluation management of dyslipidemia.  He had recent lipid testing which showed quite elevated cholesterol including total cholesterol 305, HDL 40, triglycerides 266 and LDL 212.  He was then recommended to start on statin therapy and has been on rosuvastatin 10 mg daily for the past 2 months.  He reports tolerating the medicine well.  He is not aware of any other family members with very high cholesterol or anyone with very early onset heart disease.  He is not on any particular diet such as a ketogenic diet or when it is more likely to be high in saturated fats and associated with high cholesterol.  In general he is pretty active.  He does work for [employer] and reports a high stress job.  PMHx:  History reviewed. No pertinent past medical history.  History reviewed. No pertinent surgical history.  FAMHx:  Family History  Problem Relation Age of Onset   Healthy Mother    Healthy Father     SOCHx:   reports that he has never smoked. He has never used smokeless tobacco. He reports current alcohol use. He reports that he does not currently use drugs.  ALLERGIES:  No Known Allergies  ROS: Pertinent items noted in HPI and remainder of comprehensive ROS otherwise negative.  HOME MEDS: Current Outpatient Medications on File Prior to Visit  Medication Sig Dispense Refill   citalopram (CELEXA) 20 MG tablet Take 20 mg by mouth daily.     clonazePAM (KLONOPIN) 1 MG tablet 1/2-1 tablet Orally once a day as needed for anxiety; Duration: 15 days     fluorouracil  (EFUDEX ) 5 % cream Apply topically 2 (two) times daily. 40 g 2   methylphenidate  (RITALIN LA) 20 MG 24 hr capsule Take 20 mg by mouth every morning.     rosuvastatin (CRESTOR) 10 MG tablet Take 10 mg by mouth daily.     No current facility-administered medications on file prior to visit.    LABS/IMAGING: No results found for this or any previous visit (from the past 48 hours). No results found.  LIPID PANEL: No results found for: CHOL, TRIG, HDL, CHOLHDL, VLDL, LDLCALC, LDLDIRECT  No results found for: LIPOA   WEIGHTS: Wt Readings from Last 3 Encounters:  12/22/23 208 lb (94.3 kg)  02/19/22 213 lb (96.6 kg)  03/14/20 208 lb (94.3 kg)    VITALS: BP (!) 136/94   Pulse 86   Ht 5' 10 (1.778 m)   Wt 208 lb (94.3 kg)   SpO2 97%   BMI 29.84 kg/m   EXAM: Deferred  EKG: Deferred  ASSESSMENT: Dyslipidemia with LDL greater than 190, possible familial hyperlipidemia  PLAN: 1.   Mr. Adolm has a dyslipidemia with LDL greater than 190, possibly familial hyperlipidemia although no clear high cholesterol or early onset disease in the family.  He could also have a recessive variant.    In addition he has been on some statin therapy for the past 2 months.  I would like to recheck lipids including NMR and LP(a) and see if he needs further titration of his medications.  Since he has a possible familial hyperlipidemia, guidelines recommend high intensity  statin therapy which would be 20 mg rosuvastatin to start or 40 mg atorvastatin.  Ultimately I would recommend increasing his rosuvastatin likely up to 20 mg daily.  He may need additional therapy such as a PCSK9 inhibitor based on his response.  I would ultimately like to try to drive his LDL to less than 70.  He may also benefit from genetic testing.  This is a cheek swab and send out test which likely would be covered by insurance.  Will discuss this further based on his lab results.  Thanks again for the kind referral.  Vinie KYM Maxcy, MD, [facility], FNLA, FACP  [place]  [facility]  Medical  Director of the Advanced Lipid Disorders &  Cardiovascular Risk Reduction Clinic Diplomate of the American Board of Clinical Lipidology Attending Cardiologist  Direct Dial: [phone]  Fax: [phone]  Website:  [email or web address]  Vinie BROCKS Ashten Sarnowski 12/22/2023, 9:25 PM

## 2023-12-22 NOTE — Patient Instructions (Signed)
 Medication Instructions:  Your physician recommends that you continue on your current medications as directed. Please refer to the Current Medication list given to you today.  *If you need a refill on your cardiac medications before your next appointment, please call your pharmacy*  Lab Work: FASTING- NMR and LPa as soon as able If you have labs (blood work) drawn today and your tests are completely normal, you will receive your results only by: MyChart Message (if you have MyChart) OR A paper copy in the mail If you have any lab test that is abnormal or we need to change your treatment, we will call you to review the results.  Follow-Up: At [facility], you and your health needs are our priority.  As part of our continuing mission to provide you with exceptional heart care, our providers are all part of one team.  This team includes your primary Cardiologist (physician) and Advanced Practice Providers or APPs (Physician Assistants and Nurse Practitioners) who all work together to provide you with the care you need, when you need it.  Your next appointment:   TBD- based on lab results
